# Patient Record
Sex: Female | Born: 1965
Health system: Southern US, Community
[De-identification: ages and names within clinical notes are randomized; demographics above are authoritative.]

## PROBLEM LIST (undated history)

## (undated) DIAGNOSIS — R55 Syncope and collapse: Secondary | ICD-10-CM

## (undated) DIAGNOSIS — E039 Hypothyroidism, unspecified: Secondary | ICD-10-CM

## (undated) HISTORY — DX: Syncope and collapse: R55

## (undated) HISTORY — DX: Hypothyroidism, unspecified: E03.9

---

## 1990-05-29 HISTORY — PX: TUBAL LIGATION: SHX77

## 2000-07-16 ENCOUNTER — Other Ambulatory Visit: Admission: RE | Admit: 2000-07-16 | Discharge: 2000-07-16 | Payer: Self-pay | Admitting: Gynecology

## 2003-09-26 ENCOUNTER — Emergency Department (HOSPITAL_COMMUNITY): Admission: EM | Admit: 2003-09-26 | Discharge: 2003-09-27 | Payer: Self-pay | Admitting: Emergency Medicine

## 2006-07-17 ENCOUNTER — Ambulatory Visit: Payer: Self-pay | Admitting: Internal Medicine

## 2006-09-06 ENCOUNTER — Ambulatory Visit: Payer: Self-pay | Admitting: Internal Medicine

## 2007-03-07 ENCOUNTER — Ambulatory Visit: Payer: Self-pay | Admitting: Gastroenterology

## 2010-10-11 NOTE — Assessment & Plan Note (Signed)
NAMEMarland Kitchen  Michelle Wolfe, Michelle Wolfe                     CHART#:  [id number]   DATE:                                   DOB:  1965/09/28   The patient is a 45 year old Caucasian female.  She was initially  evaluated back in February 2008 with history of transaminitis.  She was  also found to have hypothyroidism and was started on Synthroid.  She has  had a 24-pound weight loss in the last eight months.  Her LFT's were  normal on November 12, 2006.  She has had subsequent LFT's drawn March 02, 2007, and they remain normal.  Overall she is doing quite well.  She has  been rarely exercising, although she attributes this to working two jobs  right now.  She works during the day for a [employer] and  in the evenings at [employer].  She denies any abdominal pain.  Denies any nausea, vomiting, jaundice or rash.  Denies any fever or  chills.  Her appetite has remained good.  She is due for a physical exam  by Dr. Sherwood Gambler in February 2008.   CURRENT MEDICATIONS:  See list from March 07, 2007.   ALLERGIES:  No known drug allergies.   PHYSICAL EXAMINATION:  VITAL SIGNS:  Weight 195 pounds, height 70  inches, temp 98.3, blood pressure 110/72, pulse 72, BMI 28.  GENERAL:  A well-developed, well-nourished Caucasian female in no acute  distress.  HEENT:  Sclerae clear.  Nonicteric conjunctivae.  Oropharynx pink and  moist.  HEART:  Regular rate and rhythm, normal S1, S2 without murmurs, clicks,  rubs or gallops.  ABDOMEN:  Positive bowel sounds x4.  No bruits auscultated.  Soft,  nontender, nondistended.  No palpable mass or hepatosplenomegaly.  No  rebound, tenderness or guarding.  EXTREMITIES:  Without clubbing or edema bilaterally.  SKIN:  Pink, warm, and dry without any rashes or jaundice.   ASSESSMENT:  The patient is an obese 45 year old Caucasian female with  nonalcoholic steatohepatitis.  LFT's now normal.  She has done  incredibly well with weight loss through diet and exercise.   She has  hypothyroidism maintained on Synthroid.   PLAN:  1. Will refer her back to Dr. Sherwood Gambler.  She is due for complete physical      exam in February 2009 and will continue to monitor LFT's on a      routine annual basis.  If she has recurrent transaminitis she may      need further evaluation including liver biopsy, but hopefully she      will do very well with continued weight loss.  2. Her overall goal is another 20-pound weight loss to place her with      an appropriate BMI.  3. Follow up as needed.       Michelle Wolfe, N.P.  Electronically Signed     R. Roetta Sessions, M.D.  Electronically Signed    KJ/MEDQ  D:  03/07/2007  T:  03/08/2007  Job:  [id number]   cc:   Madelin Rear. Sherwood Gambler, MD

## 2011-02-06 ENCOUNTER — Other Ambulatory Visit (HOSPITAL_COMMUNITY): Payer: Self-pay | Admitting: Internal Medicine

## 2011-02-06 DIAGNOSIS — Z01419 Encounter for gynecological examination (general) (routine) without abnormal findings: Secondary | ICD-10-CM

## 2011-02-06 DIAGNOSIS — Z139 Encounter for screening, unspecified: Secondary | ICD-10-CM

## 2011-02-09 ENCOUNTER — Ambulatory Visit (HOSPITAL_COMMUNITY)
Admission: RE | Admit: 2011-02-09 | Discharge: 2011-02-09 | Disposition: A | Discharge status: Home / Self Care | Payer: BC Managed Care – PPO | Source: Ambulatory Visit | Attending: Internal Medicine | Admitting: Internal Medicine

## 2011-02-09 ENCOUNTER — Ambulatory Visit (HOSPITAL_COMMUNITY): Payer: Self-pay

## 2011-02-09 DIAGNOSIS — Z1231 Encounter for screening mammogram for malignant neoplasm of breast: Secondary | ICD-10-CM | POA: Insufficient documentation

## 2011-02-09 DIAGNOSIS — Z139 Encounter for screening, unspecified: Secondary | ICD-10-CM

## 2011-02-09 DIAGNOSIS — Z01419 Encounter for gynecological examination (general) (routine) without abnormal findings: Secondary | ICD-10-CM

## 2012-02-14 ENCOUNTER — Other Ambulatory Visit (HOSPITAL_COMMUNITY): Payer: Self-pay | Admitting: Internal Medicine

## 2012-02-14 DIAGNOSIS — Z01419 Encounter for gynecological examination (general) (routine) without abnormal findings: Secondary | ICD-10-CM

## 2012-02-26 ENCOUNTER — Ambulatory Visit (HOSPITAL_COMMUNITY)
Admission: RE | Admit: 2012-02-26 | Discharge: 2012-02-26 | Disposition: A | Discharge status: Home / Self Care | Payer: BC Managed Care – PPO | Source: Ambulatory Visit | Attending: Internal Medicine | Admitting: Internal Medicine

## 2012-02-26 DIAGNOSIS — Z01419 Encounter for gynecological examination (general) (routine) without abnormal findings: Secondary | ICD-10-CM

## 2012-02-26 DIAGNOSIS — Z1231 Encounter for screening mammogram for malignant neoplasm of breast: Secondary | ICD-10-CM | POA: Insufficient documentation

## 2012-11-06 ENCOUNTER — Ambulatory Visit: Payer: BC Managed Care – PPO | Admitting: Cardiovascular Disease

## 2012-11-13 ENCOUNTER — Ambulatory Visit (INDEPENDENT_AMBULATORY_CARE_PROVIDER_SITE_OTHER): Payer: No Typology Code available for payment source | Admitting: Cardiovascular Disease

## 2012-11-13 ENCOUNTER — Encounter: Payer: Self-pay | Admitting: Cardiovascular Disease

## 2012-11-13 VITALS — BP 120/70 | HR 50 | Resp 14 | Ht 70.0 in | Wt 217.1 lb

## 2012-11-13 DIAGNOSIS — R55 Syncope and collapse: Secondary | ICD-10-CM

## 2012-11-13 DIAGNOSIS — E039 Hypothyroidism, unspecified: Secondary | ICD-10-CM

## 2012-11-13 NOTE — Patient Instructions (Addendum)
Your physician recommends that you schedule a follow-up appointment as needed if loss of consciousness occurs again.  Your physician has requested that you have an echocardiogram. Echocardiography is a painless test that uses sound waves to create images of your heart. It provides your doctor with information about the size and shape of your heart and how well your heart's chambers and valves are working. This procedure takes approximately one hour. There are no restrictions for this procedure.  We will call you with Echo results.  Return on an as needed basis.

## 2012-11-13 NOTE — Assessment & Plan Note (Signed)
The patient's history is typical for a neurally mediated syncope: The event occurred while she was standing up, in close proximity to a medical procedure that makes her feel uncomfortable, was associated with a prodrome of lightheadedness and flushing. She has a history of syncope as an adolescent with similar symptoms and circumstances. She also has features suggesting high resting vagal tone. Her heart rate is rather slow even for some weight taking her small dose of beta blocker.  We discussed the prevention and treatment of neurallly mediated syncope: Avoid prolonged orthostasis without walking, avoid dehydration, increased sodium intake, avoidance of triggers (such as blood draws, etc.). She is already on beta blocker therapy. At this point a tilt table test does not appear to be justified but if there is a pattern of increased frequency of syncope it might provide useful information.  An echocardiogram is indicated to exclude underlying structural heart disease.

## 2012-11-13 NOTE — Progress Notes (Signed)
Patient ID: Chong January, female   DOB: 25-Jul-1965, 47 y.o.   MRN: [id number]      Reason for office visit Syncope  Mrs. Jaspers is a previously healthy 47 year old woman who had a syncopal event in her doctor's office, roughly a month ago. She just undergone removal of a "growth" from one of her toes. She was standing up to check out and felt lightheaded and flushed. She passed out, fell to the ground and woke up after a very brief loss of consciousness there is she improved and she was horizontal in her legs were elevated.  She had a similar syncopal event when she was in high school singing in the choir in a very hot room. She has noticed that she feels very "queasy" with presyncopal symptoms whenever she has blood drawn. She is not aware of any relatives that have syncope.  She has hypothyroidism and a recent TSH level was reported as being within the normal range. Several years ago a beta blocker was added "to regulate her heart beat". It sounds like her complaints were actually presyncope.    No Known Allergies  Current Outpatient Prescriptions  Medication Sig Dispense Refill  . acetaminophen (TYLENOL) 325 MG tablet Take 650 mg by mouth every 6 (six) hours as needed for pain.      Marland Kitchen atenolol (TENORMIN) 25 MG tablet Take 25 mg by mouth daily.      Marland Kitchen ibuprofen (ADVIL,MOTRIN) 200 MG tablet Take 200 mg by mouth every 6 (six) hours as needed for pain.      Marland Kitchen levothyroxine (SYNTHROID, LEVOTHROID) 125 MCG tablet Take 125 mcg by mouth daily before breakfast.      . Multiple Vitamins-Minerals (HAIR/SKIN/NAILS PO) Take 1 tablet by mouth 3 (three) times daily.       No current facility-administered medications for this visit.    Past Medical History  Diagnosis Date  . Hypothyroidism   . Syncope     Past Surgical History  Procedure Laterality Date  . Tubal ligation  1992    Family History  Problem Relation Age of Onset  . Alzheimer's disease Mother   . Prostate cancer Father   .  Hypertension Sister   . Syncope episode Sister     pacemaker    History   Social History  . Marital Status: Married    Spouse Name: N/A    Number of Children: N/A  . Years of Education: N/A   Occupational History  . Not on file.   Social History Main Topics  . Smoking status: Never Smoker   . Smokeless tobacco: Not on file  . Alcohol Use: Yes     Comment: seldom  . Drug Use: No  . Sexually Active: Not on file   Other Topics Concern  . Not on file   Social History Narrative  . No narrative on file    Review of systems: The patient specifically denies any chest pain at rest or with exertion, dyspnea at rest or with exertion, orthopnea, paroxysmal nocturnal dyspnea, palpitations, focal neurological deficits, intermittent claudication, lower extremity edema, unexplained weight gain, cough, hemoptysis or wheezing.  The patient also denies abdominal pain, nausea, vomiting, dysphagia, diarrhea, constipation, polyuria, polydipsia, dysuria, hematuria, frequency, urgency, abnormal bleeding or bruising, fever, chills, unexpected weight changes, mood swings, change in skin or hair texture, change in voice quality, auditory or visual problems, allergic reactions or rashes, new musculoskeletal complaints other than usual "aches and pains".   PHYSICAL EXAM BP 120/70  Pulse  50  Resp 14  Ht 5\' 10"  (1.778 m)  Wt 98.476 kg (217 lb 1.6 oz)  BMI 31.15 kg/m2  General: Alert, oriented x3, no distress, mildly obese Head: no evidence of trauma, PERRL, EOMI, no exophtalmos or lid lag, no myxedema, no xanthelasma; normal ears, nose and oropharynx Neck: normal jugular venous pulsations and no hepatojugular reflux; brisk carotid pulses without delay and no carotid bruits Chest: clear to auscultation, no signs of consolidation by percussion or palpation, normal fremitus, symmetrical and full respiratory excursions Cardiovascular: normal position and quality of the apical impulse, regular rhythm,  normal first and second heart sounds, no murmurs, rubs or gallops Abdomen: no tenderness or distention, no masses by palpation, no abnormal pulsatility or arterial bruits, normal bowel sounds, no hepatosplenomegaly Extremities: no clubbing, cyanosis or edema; 2+ radial, ulnar and brachial pulses bilaterally; 2+ right femoral, posterior tibial and dorsalis pedis pulses; 2+ left femoral, posterior tibial and dorsalis pedis pulses; no subclavian or femoral bruits Neurological: grossly nonfocal   EKG: Sinus bradycardia  ASSESSMENT AND PLAN Neurocardiogenic syncope The patient's history is typical for a neurally mediated syncope: The event occurred while she was standing up, in close proximity to a medical procedure that makes her feel uncomfortable, was associated with a prodrome of lightheadedness and flushing. She has a history of syncope as an adolescent with similar symptoms and circumstances. She also has features suggesting high resting vagal tone. Her heart rate is rather slow even for some weight taking her small dose of beta blocker.  We discussed the prevention and treatment of neurallly mediated syncope: Avoid prolonged orthostasis without walking, avoid dehydration, increased sodium intake, avoidance of triggers (such as blood draws, etc.). She is already on beta blocker therapy. At this point a tilt table test does not appear to be justified but if there is a pattern of increased frequency of syncope it might provide useful information.  An echocardiogram is indicated to exclude underlying structural heart disease.  Orders Placed This Encounter  Procedures  . 2D Echocardiogram without contrast   Meds ordered this encounter  Medications  . atenolol (TENORMIN) 25 MG tablet    Sig: Take 25 mg by mouth daily.  Marland Kitchen levothyroxine (SYNTHROID, LEVOTHROID) 125 MCG tablet    Sig: Take 125 mcg by mouth daily before breakfast.  . ibuprofen (ADVIL,MOTRIN) 200 MG tablet    Sig: Take 200 mg by  mouth every 6 (six) hours as needed for pain.  . Multiple Vitamins-Minerals (HAIR/SKIN/NAILS PO)    Sig: Take 1 tablet by mouth 3 (three) times daily.  Marland Kitchen acetaminophen (TYLENOL) 325 MG tablet    Sig: Take 650 mg by mouth every 6 (six) hours as needed for pain.    Junious Silk, MD, [facility] [phone] office [phone] pager

## 2012-11-14 ENCOUNTER — Encounter: Payer: Self-pay | Admitting: Cardiovascular Disease

## 2012-11-27 ENCOUNTER — Ambulatory Visit (HOSPITAL_COMMUNITY)
Admission: RE | Admit: 2012-11-27 | Discharge: 2012-11-27 | Disposition: A | Discharge status: Home / Self Care | Payer: No Typology Code available for payment source | Source: Ambulatory Visit | Attending: Internal Medicine | Admitting: Internal Medicine

## 2012-11-27 DIAGNOSIS — R55 Syncope and collapse: Secondary | ICD-10-CM | POA: Insufficient documentation

## 2012-11-27 NOTE — Progress Notes (Signed)
[facility]   2D echo completed 11/27/2012.   Cindy Kento Gossman, RDCS  

## 2014-02-09 ENCOUNTER — Other Ambulatory Visit (HOSPITAL_COMMUNITY): Payer: Self-pay | Admitting: Internal Medicine

## 2014-02-09 DIAGNOSIS — Z139 Encounter for screening, unspecified: Secondary | ICD-10-CM

## 2014-02-09 LAB — HM PAP SMEAR

## 2014-02-12 ENCOUNTER — Ambulatory Visit (HOSPITAL_COMMUNITY)
Admission: RE | Admit: 2014-02-12 | Discharge: 2014-02-12 | Disposition: A | Discharge status: Home / Self Care | Payer: BC Managed Care – PPO | Source: Ambulatory Visit | Attending: Internal Medicine | Admitting: Internal Medicine

## 2014-02-12 DIAGNOSIS — Z1231 Encounter for screening mammogram for malignant neoplasm of breast: Secondary | ICD-10-CM | POA: Insufficient documentation

## 2014-02-12 DIAGNOSIS — Z139 Encounter for screening, unspecified: Secondary | ICD-10-CM

## 2014-02-17 ENCOUNTER — Encounter: Payer: Self-pay | Admitting: *Deleted

## 2014-02-17 DIAGNOSIS — E669 Obesity, unspecified: Secondary | ICD-10-CM

## 2014-02-19 ENCOUNTER — Other Ambulatory Visit (HOSPITAL_COMMUNITY)
Admission: RE | Admit: 2014-02-19 | Discharge: 2014-02-19 | Disposition: A | Discharge status: Home / Self Care | Payer: BC Managed Care – PPO | Source: Ambulatory Visit | Attending: Obstetrics & Gynecology | Admitting: Obstetrics & Gynecology

## 2014-02-19 ENCOUNTER — Encounter: Payer: Self-pay | Admitting: Obstetrics & Gynecology

## 2014-02-19 ENCOUNTER — Ambulatory Visit (INDEPENDENT_AMBULATORY_CARE_PROVIDER_SITE_OTHER): Payer: BC Managed Care – PPO | Admitting: Obstetrics & Gynecology

## 2014-02-19 VITALS — BP 98/60 | Ht 70.0 in | Wt 219.0 lb

## 2014-02-19 DIAGNOSIS — Z1389 Encounter for screening for other disorder: Secondary | ICD-10-CM

## 2014-02-19 DIAGNOSIS — Z1151 Encounter for screening for human papillomavirus (HPV): Secondary | ICD-10-CM | POA: Diagnosis present

## 2014-02-19 DIAGNOSIS — Z01419 Encounter for gynecological examination (general) (routine) without abnormal findings: Secondary | ICD-10-CM | POA: Insufficient documentation

## 2014-02-19 DIAGNOSIS — R87610 Atypical squamous cells of undetermined significance on cytologic smear of cervix (ASC-US): Secondary | ICD-10-CM

## 2014-02-19 DIAGNOSIS — Z87440 Personal history of urinary (tract) infections: Secondary | ICD-10-CM

## 2014-02-19 LAB — POCT URINALYSIS DIPSTICK
Blood, UA: NEGATIVE
GLUCOSE UA: NEGATIVE
KETONES UA: NEGATIVE
Leukocytes, UA: NEGATIVE
NITRITE UA: NEGATIVE
Protein, UA: NEGATIVE

## 2014-02-19 NOTE — Progress Notes (Signed)
Patient ID: Michelle Wolfe, female   DOB: 1966/05/28, 48 y.o.   MRN: [id number] Chief Complaint  Patient presents with  . Referral    Need HPV done.    HPI Pt with ASCUS pap, never had an abnormal before, no history of problems  ROS No burning with urination, frequency or urgency No nausea, vomiting or diarrhea Nor fever chills or other constitutional symptoms   Blood pressure 98/60, height  (1.778 m), weight 219 lb (99.338 kg), last menstrual period 01/29/2014.  EXAM Abdomen:      soft Vulva:            normal appearing vulva with no masses, tenderness or lesions Vagina:          normal mucosa, no discharge Cervix:           normal appearance and HPV typing done Uterus:           Adnexa:          Rectal:            Hemoccult:                                Assessment/Plan:  ASCUS, HPV unknown, :  HPV done today  Follow up based on that

## 2014-02-20 LAB — CYTOLOGY - PAP

## 2014-09-27 IMAGING — MG MM DIGITAL SCREENING BILAT W/ CAD
4 series · 4 of 4 positions shown · non-contrast
Comparison: Previous exam(s).

CLINICAL DATA: Screening.

EXAM:
DIGITAL SCREENING BILATERAL MAMMOGRAM WITH CAD

[L CC]
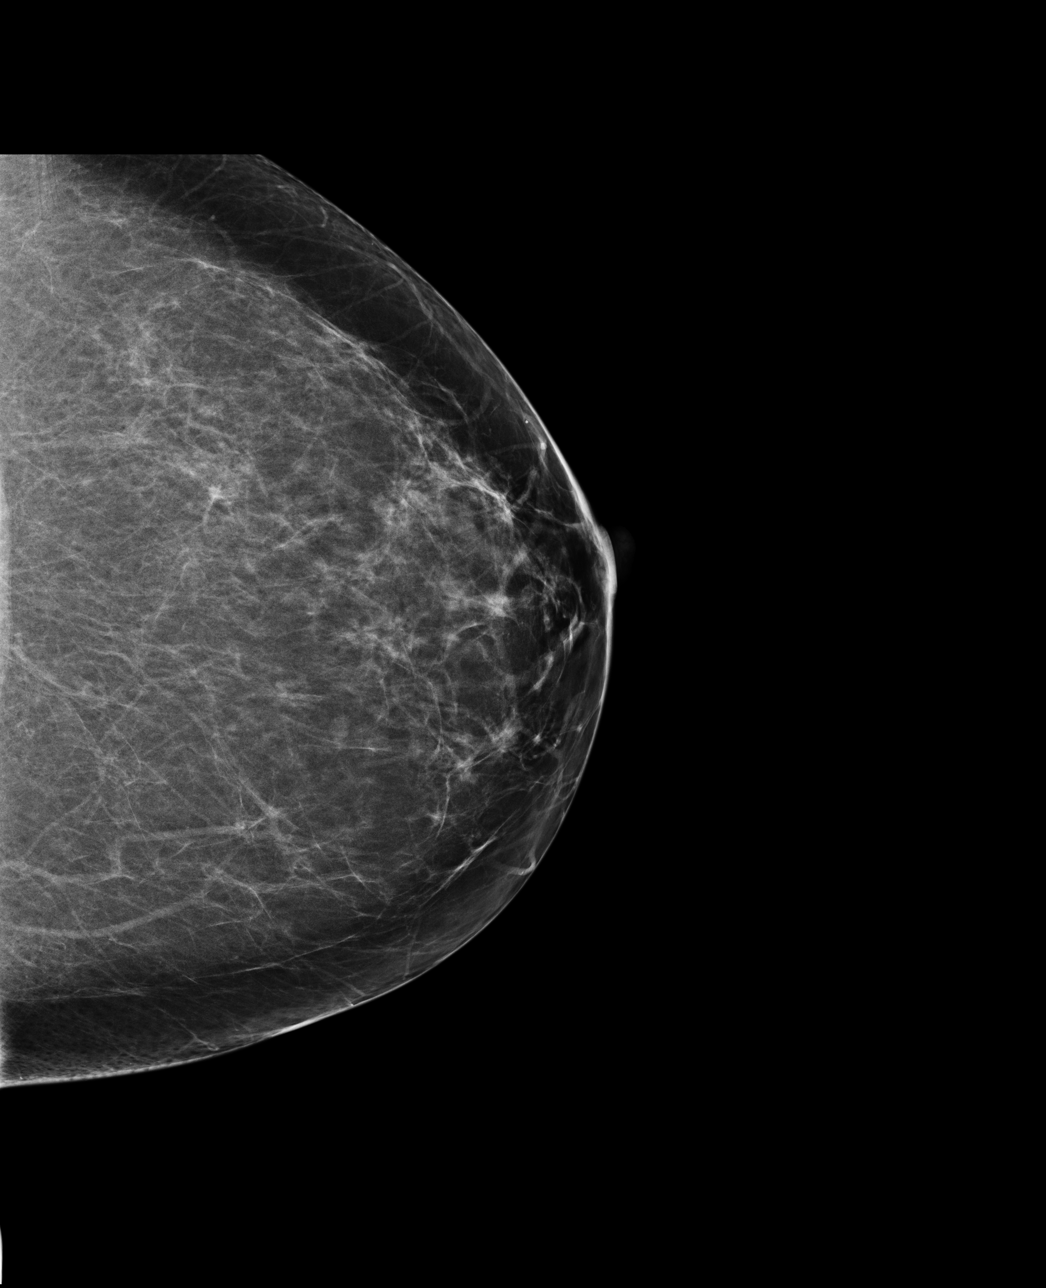

[L MLO]
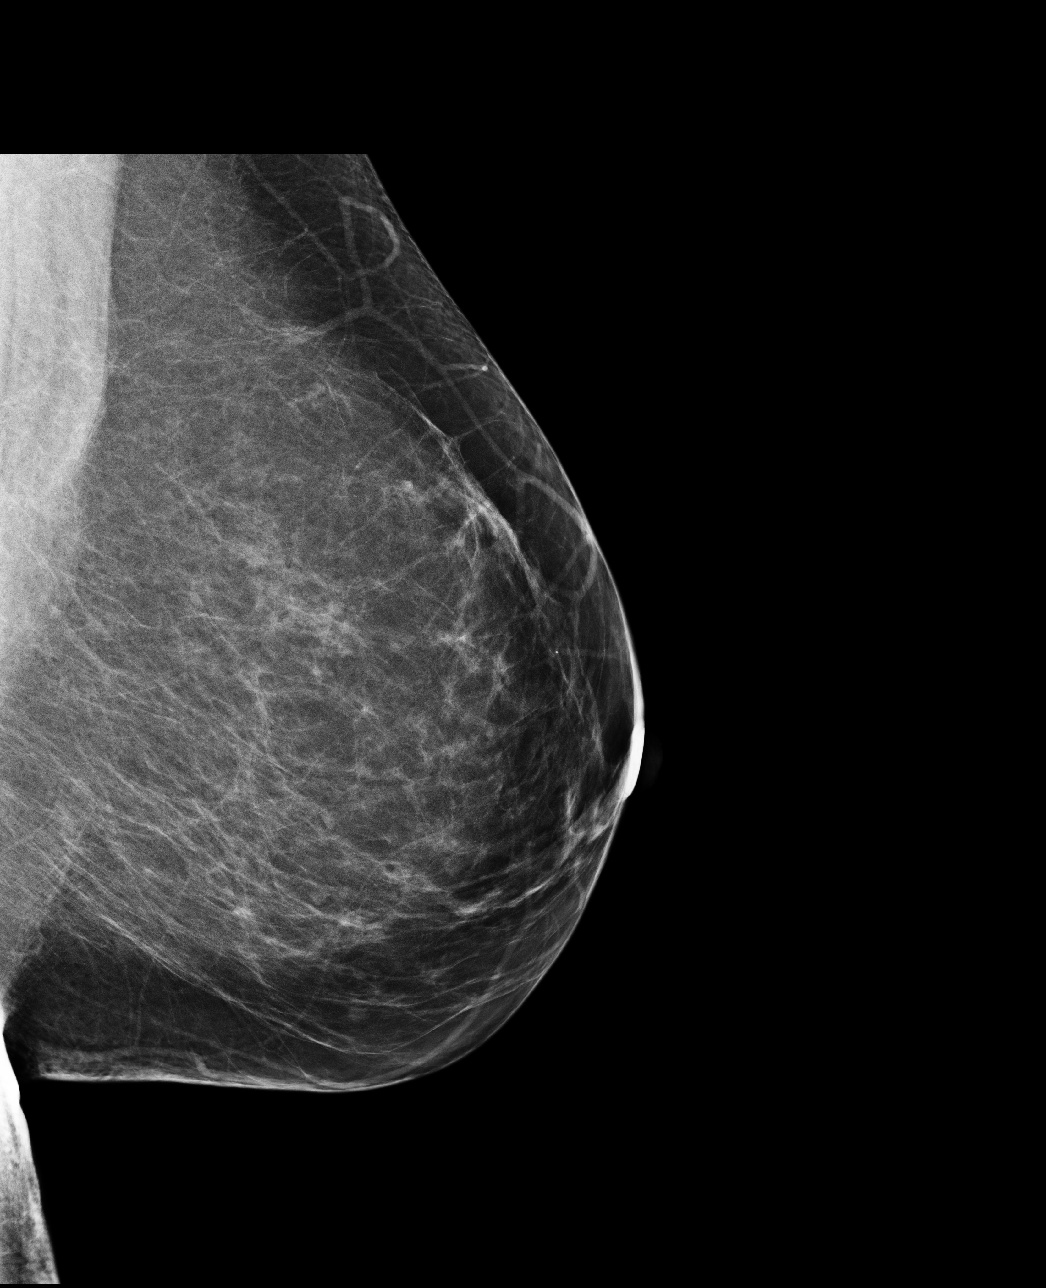

[R CC]
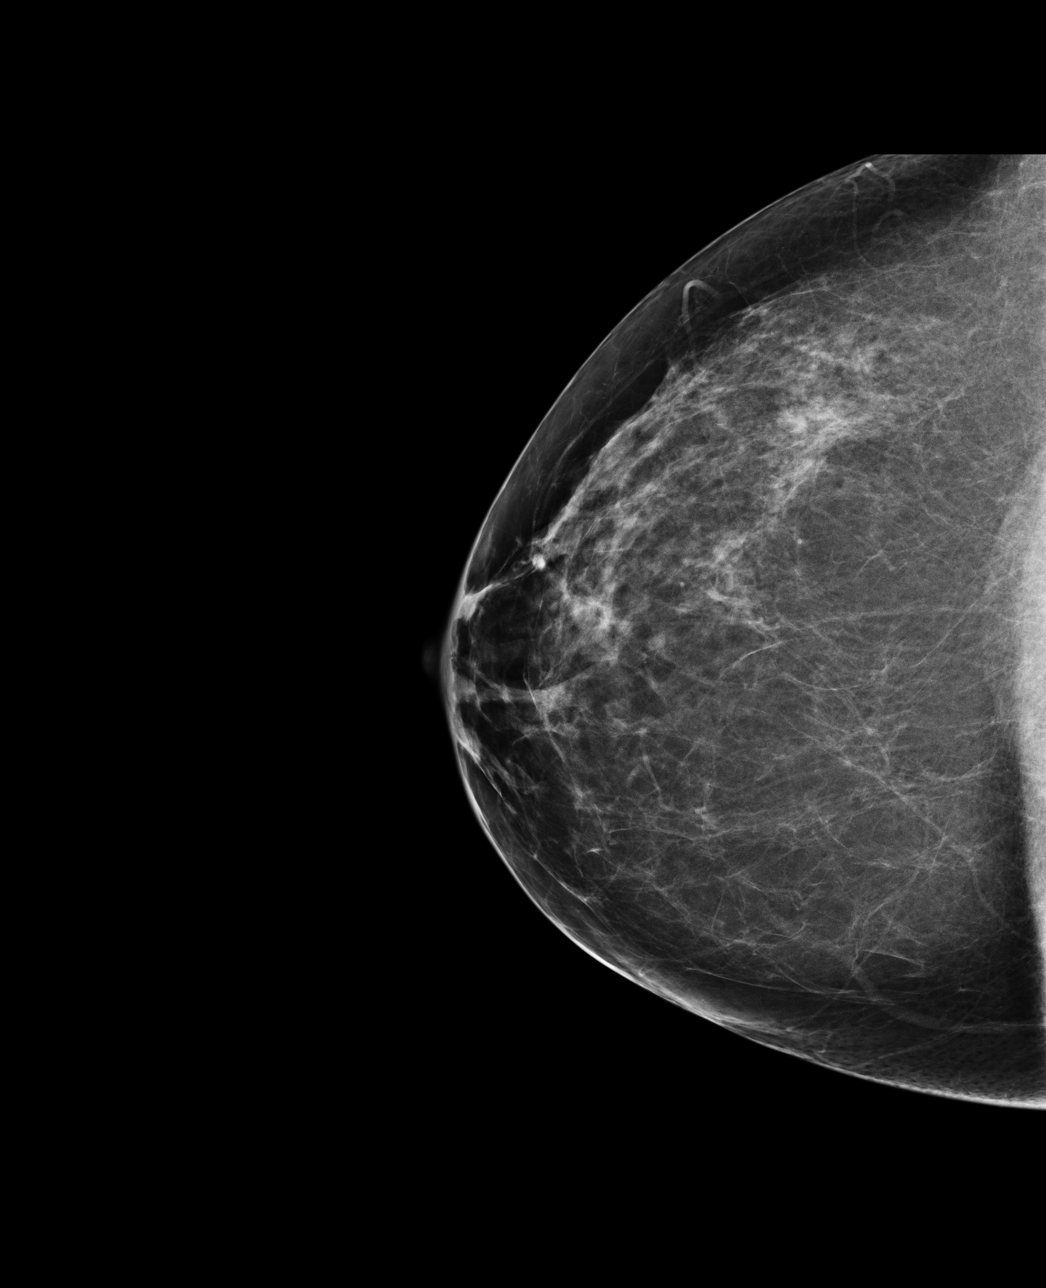

[R MLO]
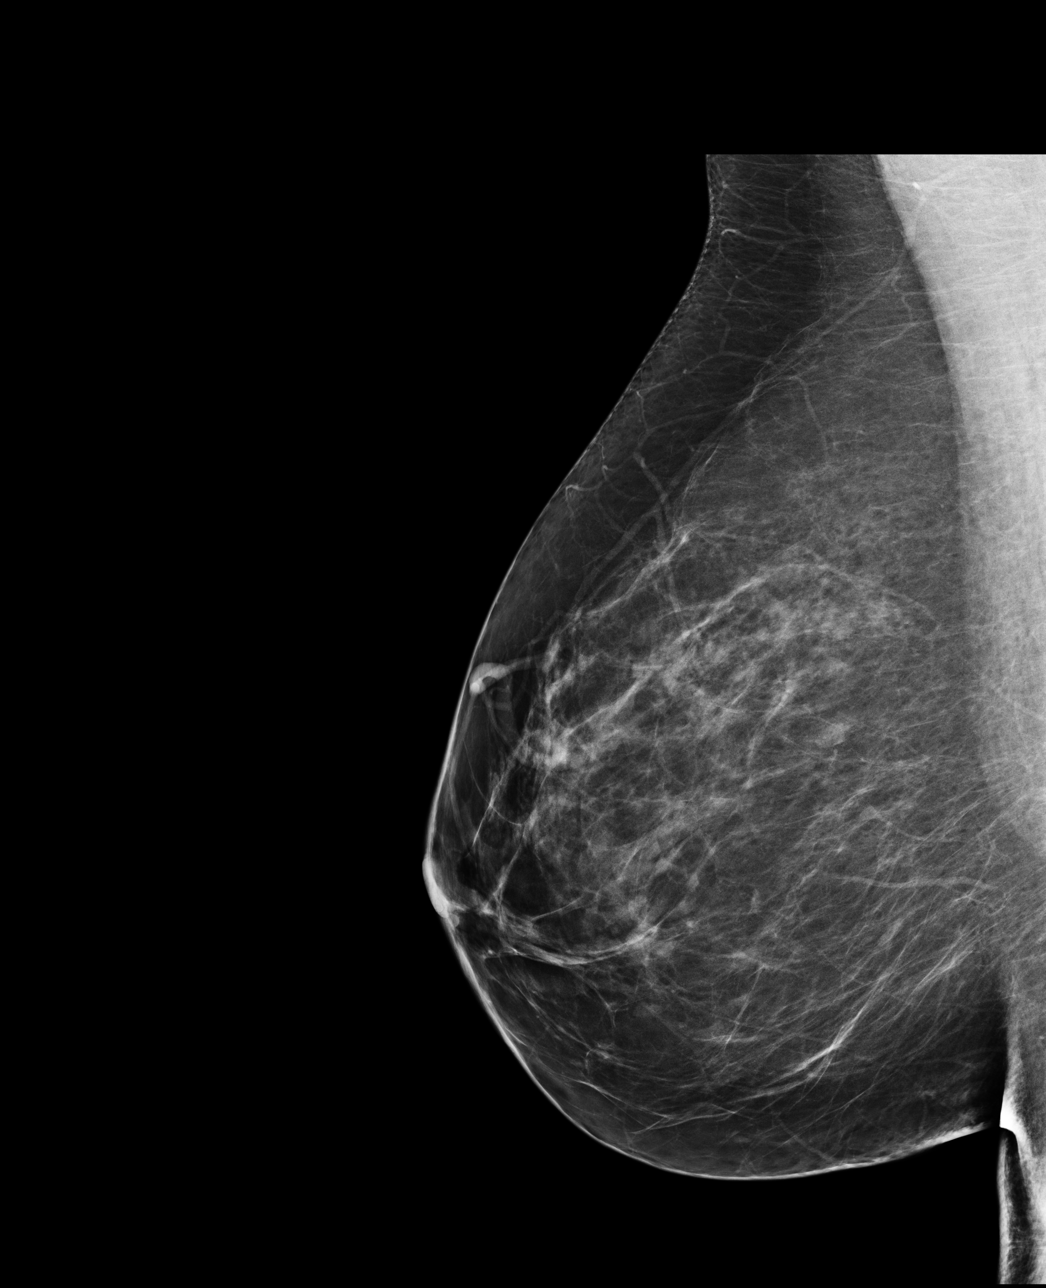

[4 of 4 positions shown; findings below may reference images not displayed]

ACR Breast Density Category b: There are scattered areas of
fibroglandular density.
FINDINGS: There are no findings suspicious for malignancy. Images were
processed with CAD.
IMPRESSION: No mammographic evidence of malignancy. A result letter of this
screening mammogram will be mailed directly to the patient.

RECOMMENDATION:
Screening mammogram in one year. (Code:AS-G-LCT)

BI-RADS CATEGORY  1: Negative.

## 2016-09-30 DIAGNOSIS — R05 Cough: Secondary | ICD-10-CM | POA: Diagnosis not present

## 2016-09-30 DIAGNOSIS — J301 Allergic rhinitis due to pollen: Secondary | ICD-10-CM | POA: Diagnosis not present

## 2016-10-04 DIAGNOSIS — Z6834 Body mass index (BMI) 34.0-34.9, adult: Secondary | ICD-10-CM | POA: Diagnosis not present

## 2016-10-04 DIAGNOSIS — J309 Allergic rhinitis, unspecified: Secondary | ICD-10-CM | POA: Diagnosis not present

## 2016-10-04 DIAGNOSIS — J0100 Acute maxillary sinusitis, unspecified: Secondary | ICD-10-CM | POA: Diagnosis not present

## 2017-04-11 DIAGNOSIS — J302 Other seasonal allergic rhinitis: Secondary | ICD-10-CM | POA: Diagnosis not present

## 2017-04-11 DIAGNOSIS — E039 Hypothyroidism, unspecified: Secondary | ICD-10-CM | POA: Diagnosis not present

## 2017-04-11 DIAGNOSIS — R002 Palpitations: Secondary | ICD-10-CM | POA: Diagnosis not present

## 2017-04-11 DIAGNOSIS — Z23 Encounter for immunization: Secondary | ICD-10-CM | POA: Diagnosis not present

## 2017-04-11 DIAGNOSIS — H00024 Hordeolum internum left upper eyelid: Secondary | ICD-10-CM | POA: Diagnosis not present

## 2017-04-23 DIAGNOSIS — D1800 Hemangioma unspecified site: Secondary | ICD-10-CM | POA: Diagnosis not present

## 2017-04-23 DIAGNOSIS — D239 Other benign neoplasm of skin, unspecified: Secondary | ICD-10-CM | POA: Diagnosis not present

## 2018-04-23 DIAGNOSIS — L570 Actinic keratosis: Secondary | ICD-10-CM | POA: Diagnosis not present

## 2018-04-23 DIAGNOSIS — D239 Other benign neoplasm of skin, unspecified: Secondary | ICD-10-CM | POA: Diagnosis not present

## 2018-05-09 DIAGNOSIS — J019 Acute sinusitis, unspecified: Secondary | ICD-10-CM | POA: Diagnosis not present

## 2018-05-09 DIAGNOSIS — Z6834 Body mass index (BMI) 34.0-34.9, adult: Secondary | ICD-10-CM | POA: Diagnosis not present

## 2018-05-20 DIAGNOSIS — Z23 Encounter for immunization: Secondary | ICD-10-CM | POA: Diagnosis not present

## 2019-07-30 DIAGNOSIS — L920 Granuloma annulare: Secondary | ICD-10-CM | POA: Diagnosis not present

## 2019-10-15 DIAGNOSIS — Z01419 Encounter for gynecological examination (general) (routine) without abnormal findings: Secondary | ICD-10-CM | POA: Diagnosis not present

## 2019-10-15 DIAGNOSIS — Z6837 Body mass index (BMI) 37.0-37.9, adult: Secondary | ICD-10-CM | POA: Diagnosis not present

## 2019-10-29 DIAGNOSIS — Z1211 Encounter for screening for malignant neoplasm of colon: Secondary | ICD-10-CM | POA: Diagnosis not present

## 2019-10-29 DIAGNOSIS — Z1212 Encounter for screening for malignant neoplasm of rectum: Secondary | ICD-10-CM | POA: Diagnosis not present

## 2019-12-23 DIAGNOSIS — Z1231 Encounter for screening mammogram for malignant neoplasm of breast: Secondary | ICD-10-CM | POA: Diagnosis not present

## 2020-03-05 DIAGNOSIS — L247 Irritant contact dermatitis due to plants, except food: Secondary | ICD-10-CM | POA: Diagnosis not present

## 2020-05-05 DIAGNOSIS — L570 Actinic keratosis: Secondary | ICD-10-CM | POA: Diagnosis not present

## 2020-05-05 DIAGNOSIS — L910 Hypertrophic scar: Secondary | ICD-10-CM | POA: Diagnosis not present
